# Patient Record
Sex: Male | Born: 1975 | Race: White | Hispanic: No | Smoking: Never smoker
Health system: Southern US, Community
[De-identification: ages and names within clinical notes are randomized; demographics above are authoritative.]

## PROBLEM LIST (undated history)

## (undated) DIAGNOSIS — E119 Type 2 diabetes mellitus without complications: Secondary | ICD-10-CM

## (undated) HISTORY — PX: ELBOW SURGERY: SHX618

---

## 2003-12-07 ENCOUNTER — Other Ambulatory Visit: Payer: Self-pay

## 2019-11-05 ENCOUNTER — Encounter (HOSPITAL_COMMUNITY): Payer: Self-pay | Admitting: Emergency Medicine

## 2019-11-05 ENCOUNTER — Emergency Department (HOSPITAL_COMMUNITY): Payer: Self-pay

## 2019-11-05 ENCOUNTER — Other Ambulatory Visit: Payer: Self-pay

## 2019-11-05 ENCOUNTER — Emergency Department (HOSPITAL_COMMUNITY)
Admission: EM | Admit: 2019-11-05 | Discharge: 2019-11-05 | Disposition: A | Discharge status: Home / Self Care | Payer: Self-pay | Attending: Emergency Medicine | Admitting: Emergency Medicine

## 2019-11-05 DIAGNOSIS — R569 Unspecified convulsions: Secondary | ICD-10-CM | POA: Insufficient documentation

## 2019-11-05 DIAGNOSIS — Z794 Long term (current) use of insulin: Secondary | ICD-10-CM | POA: Insufficient documentation

## 2019-11-05 DIAGNOSIS — E1165 Type 2 diabetes mellitus with hyperglycemia: Secondary | ICD-10-CM | POA: Insufficient documentation

## 2019-11-05 DIAGNOSIS — E162 Hypoglycemia, unspecified: Secondary | ICD-10-CM

## 2019-11-05 HISTORY — DX: Type 2 diabetes mellitus without complications: E11.9

## 2019-11-05 LAB — COMPREHENSIVE METABOLIC PANEL
ALT: 28 U/L (ref 0–44)
AST: 35 U/L (ref 15–41)
Albumin: 4 g/dL (ref 3.5–5.0)
Alkaline Phosphatase: 78 U/L (ref 38–126)
Anion gap: 12 (ref 5–15)
BUN: 9 mg/dL (ref 6–20)
CO2: 20 mmol/L — ABNORMAL LOW (ref 22–32)
Calcium: 9.2 mg/dL (ref 8.9–10.3)
Chloride: 106 mmol/L (ref 98–111)
Creatinine, Ser: 1 mg/dL (ref 0.61–1.24)
GFR calc Af Amer: >60 mL/min (ref >60)
GFR calc non Af Amer: >60 mL/min (ref >60)
Glucose, Bld: 86 mg/dL (ref 70–99)
Potassium: 4.4 mmol/L (ref 3.5–5.1)
Sodium: 138 mmol/L (ref 135–145)
Total Bilirubin: 0.8 mg/dL (ref 0.3–1.2)
Total Protein: 7.1 g/dL (ref 6.5–8.1)

## 2019-11-05 LAB — CBC WITH DIFFERENTIAL/PLATELET
Abs Immature Granulocytes: 0.01 10*3/uL (ref 0.00–0.07)
Basophils Absolute: 0 10*3/uL (ref 0.0–0.1)
Basophils Relative: 0 %
Eosinophils Absolute: 0 10*3/uL (ref 0.0–0.5)
Eosinophils Relative: 0 %
HCT: 46.4 % (ref 39.0–52.0)
Hemoglobin: 15.1 g/dL (ref 13.0–17.0)
Immature Granulocytes: 0 %
Lymphocytes Relative: 18 %
Lymphs Abs: 1.3 10*3/uL (ref 0.7–4.0)
MCH: 28.5 pg (ref 26.0–34.0)
MCHC: 32.5 g/dL (ref 30.0–36.0)
MCV: 87.7 fL (ref 80.0–100.0)
Monocytes Absolute: 0.6 10*3/uL (ref 0.1–1.0)
Monocytes Relative: 8 %
Neutro Abs: 5.5 10*3/uL (ref 1.7–7.7)
Neutrophils Relative %: 74 %
Platelets: 312 10*3/uL (ref 150–400)
RBC: 5.29 MIL/uL (ref 4.22–5.81)
RDW: 13.2 % (ref 11.5–15.5)
WBC: 7.4 10*3/uL (ref 4.0–10.5)
nRBC: 0 % (ref 0.0–0.2)

## 2019-11-05 LAB — CBG MONITORING, ED
Glucose-Capillary: 69 mg/dL — ABNORMAL LOW (ref 70–99)
Glucose-Capillary: 84 mg/dL (ref 70–99)

## 2019-11-05 LAB — ETHANOL: Alcohol, Ethyl (B): 13 mg/dL — ABNORMAL HIGH (ref <10)

## 2019-11-05 LAB — MAGNESIUM: Magnesium: 2.1 mg/dL (ref 1.7–2.4)

## 2019-11-05 MED ORDER — LORAZEPAM 2 MG/ML IJ SOLN: 1.0000 mg | Freq: Once | INTRAMUSCULAR | Status: DC | PRN

## 2019-11-05 NOTE — ED Notes (Signed)
Patient transported to CT 

## 2019-11-05 NOTE — ED Triage Notes (Signed)
Per [facility] pt coming from girlfriends house after having witnessed seizure this am. Unsure how long, EMS reports pt post ictal on arrival and throughout transport. Initial CBG 55, given D10 and CBG to 90. Patient alert and orientated x 4 on arrival. Bite marks on tongue. Denies any memory of seizure. Reports drinking wine last night but denies drinking regularly. Diabetic takes 70/30 but has not taken today.

## 2019-11-05 NOTE — ED Notes (Signed)
CBG collected. Result "60." RN, Florentina Addison R., notified.

## 2019-11-05 NOTE — ED Notes (Signed)
Pt ambulated to the restroom with a steady gait and no complaints.

## 2019-11-05 NOTE — ED Provider Notes (Signed)
Emergency Department Provider Note   I have reviewed the triage vital signs and the nursing notes.   HISTORY  Chief Complaint Seizures   HPI Douglas Carson is a 44 y.o. male with a history of insulin-dependent diabetes who presents to the emergency department today secondary to seizure activity.  Ultimately figured out that the patient had a blood sugar less than 30 and fianc woke up with him shaking and apparent seizure-like activity.  Glucose was given on EMS arrival it was in the 50s.  Did have some blood around his mouth.  Patient does not remember what happened this morning but ultimately was able to remember that he did take sling last night had a blood sugar of 90 and actually decrease his dose a little bit but took it prior to bedtime.  He did have a couple alcoholic drinks but was not intoxicated.   No other associated or modifying symptoms.    Past Medical History:  Diagnosis Date  . Diabetes mellitus without complication (HCC)     There are no problems to display for this patient.   Past Surgical History:  Procedure Laterality Date  . ELBOW SURGERY        Allergies Patient has no known allergies.  No family history on file.  Social History Social History   Tobacco Use  . Smoking status: Never Smoker  . Smokeless tobacco: Never Used  Substance Use Topics  . Alcohol use: Yes  . Drug use: Not Currently    Review of Systems  All other systems negative except as documented in the HPI. All pertinent positives and negatives as reviewed in the HPI. ____________________________________________   PHYSICAL EXAM:  VITAL SIGNS: ED Triage Vitals  Enc Vitals Group     BP 11/05/19 0851 (!) 127/95     Pulse Rate 11/05/19 0851 71     Resp 11/05/19 0851 18     Temp 11/05/19 0851 97.7 F (36.5 C)     Temp Source 11/05/19 0851 Oral     SpO2 11/05/19 0851 100 %     Weight 11/05/19 0858 220 lb (99.8 kg)     Height 11/05/19 0858 6\' 1"  (1.854 m)     Constitutional: Alert and oriented. Well appearing and in no acute distress. Eyes: Conjunctivae are normal. PERRL. EOMI. Head: Atraumatic. Nose: No congestion/rhinnorhea. Mouth/Throat: Mucous membranes are moist.  Oropharynx non-erythematous. Bilateral tongue bites Neck: No stridor.  No meningeal signs.   Cardiovascular: Normal rate, regular rhythm. Good peripheral circulation. Grossly normal heart sounds.   Respiratory: Normal respiratory effort.  No retractions. Lungs CTAB. Gastrointestinal: Soft and nontender. No distention.  Musculoskeletal: No lower extremity tenderness nor edema. No gross deformities of extremities. Neurologic:  Normal speech and language. No gross focal neurologic deficits are appreciated.  Skin:  Skin is warm, dry and intact. No rash noted.   ____________________________________________   LABS (all labs ordered are listed, but only abnormal results are displayed)  Labs Reviewed  COMPREHENSIVE METABOLIC PANEL - Abnormal; Notable for the following components:      Result Value   CO2 20 (*)    All other components within normal limits  ETHANOL - Abnormal; Notable for the following components:   Alcohol, Ethyl (B) 13 (*)    All other components within normal limits  CBG MONITORING, ED - Abnormal; Notable for the following components:   Glucose-Capillary 69 (*)    All other components within normal limits  CBC WITH DIFFERENTIAL/PLATELET  MAGNESIUM  URINALYSIS, ROUTINE  W REFLEX MICROSCOPIC  CBG MONITORING, ED   ____________________________________________  EKG   EKG Interpretation  Date/Time:  Sunday November 05 2019 08:53:26 EDT Ventricular Rate:  97 PR Interval:    QRS Duration: 100 QT Interval:  367 QTC Calculation: 467 R Axis:   84 Text Interpretation: Sinus rhythm ST elev, probable normal early repol pattern waves with higher amplitude than previous Confirmed by Merrily Pew [phone]) on 11/05/2019 9:05:14 AM       ____________________________________________  RADIOLOGY  DG Chest 2 View  Result Date: 11/05/2019 CLINICAL DATA:  Evaluate for seizure. EXAM: CHEST - 2 VIEW COMPARISON:  Report from chest radiograph 12/07/2003 (images unavailable) FINDINGS: Heart size within normal limits. No evidence of airspace consolidation within the lungs. No evidence of pleural effusion or pneumothorax. No acute bony abnormality. IMPRESSION: No evidence of acute cardiopulmonary abnormality. Electronically Signed   By: Kellie Simmering DO   On: 11/05/2019 09:32   CT HEAD WO CONTRAST  Result Date: 11/05/2019 CLINICAL DATA:  Headache, head trauma. Additional history obtained from [place] seizure. EXAM: CT HEAD WITHOUT CONTRAST TECHNIQUE: Contiguous axial images were obtained from the base of the skull through the vertex without intravenous contrast. COMPARISON:  Report from head CT 12/07/2003 (images unavailable) FINDINGS: Brain: There is no evidence of acute intracranial hemorrhage, intracranial mass, midline shift or extra-axial fluid collection.No demarcated cortical infarction. Vascular: No hyperdense vessel. Skull: Normal. Negative for fracture or focal lesion. Sinuses/Orbits: Visualized orbits demonstrate no acute abnormality. Mild paranasal sinus mucosal thickening greatest within the right maxillary sinus. There is also a small air-fluid level within the right maxillary sinus. Right sphenoid sinus mucous retention cyst. No significant mastoid effusion. IMPRESSION: No evidence of acute intracranial abnormality. Paranasal sinus disease as described. This includes a small right maxillary sinus air-fluid level. Correlate for acute on chronic sinusitis. Electronically Signed   By: Kellie Simmering DO   On: 11/05/2019 09:52    ____________________________________________   PROCEDURES  Procedure(s) performed:   Procedures   ____________________________________________   INITIAL IMPRESSION /  ASSESSMENT AND PLAN / ED COURSE  Info ultimately came out that patient was significantly hypoglycemic and is likely cause of seizure activity. No indication for neuro follow up. Will not start anti-epileptics for the same. cbg's controlled here. Observed for multiple hours without recurrent hypoglycemia or seizure. Renal function unremarkable. Will not change insulin dose at this time. Will need fu w pcp for same.      Pertinent labs & imaging results that were available during my care of the patient were reviewed by me and considered in my medical decision making (see chart for details).   A medical screening exam was performed and I feel the patient has had an appropriate workup for their chief complaint at this time and likelihood of emergent condition existing is low. They have been counseled on decision, discharge, follow up and which symptoms necessitate immediate return to the emergency department. They or their family verbally stated understanding and agreement with plan and discharged in stable condition.   ____________________________________________  FINAL CLINICAL IMPRESSION(S) / ED DIAGNOSES  Final diagnoses:  Seizure ([place])  Hypoglycemia     MEDICATIONS GIVEN DURING THIS VISIT:  Medications  LORazepam (ATIVAN) injection 1 mg (has no administration in time range)     NEW OUTPATIENT MEDICATIONS STARTED DURING THIS VISIT:  New Prescriptions   No medications on file    Note:  This note was prepared with assistance of Dragon voice recognition software. Occasional wrong-word or  sound-a-like substitutions may have occurred due to the inherent limitations of voice recognition software.   Neeta Storey, Barbara Cower, MD 11/06/19 [phone]

## 2019-11-05 NOTE — ED Notes (Signed)
Pt provided cup of sprite. Okayed by RN, [place] Planas

## 2019-11-05 NOTE — ED Notes (Signed)
Patient transported to X-ray 

## 2019-11-11 ENCOUNTER — Ambulatory Visit: Payer: Self-pay

## 2020-07-27 IMAGING — CT CT HEAD W/O CM
3 series · 16 of 47 positions shown, 19 images · non-contrast
Comparison: Report from head CT 12/07/2003 (images unavailable)

CLINICAL DATA: Headache, head trauma. Additional history obtained
from electronic medical record: Witness seizure.

EXAM:
CT HEAD WITHOUT CONTRAST
TECHNIQUE: Contiguous axial images were obtained from the base of the skull
through the vertex without intravenous contrast.

[Series 3: head 5.0 h30s · axial · 0.44mm/px · z∈[-134,+1]mm · 10 of 33 slices shown, 13 images]
[im 3/33  brain]
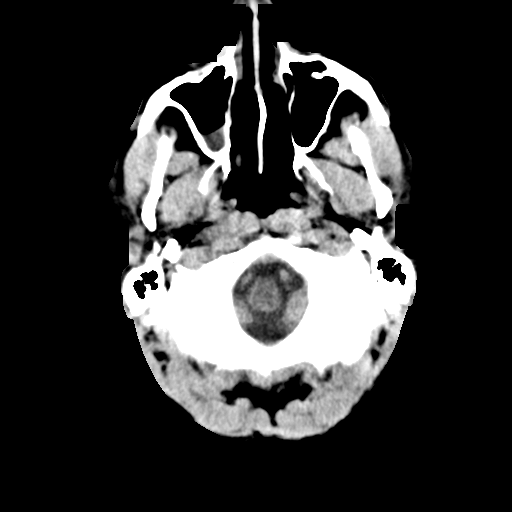
[im 3/33  bone]
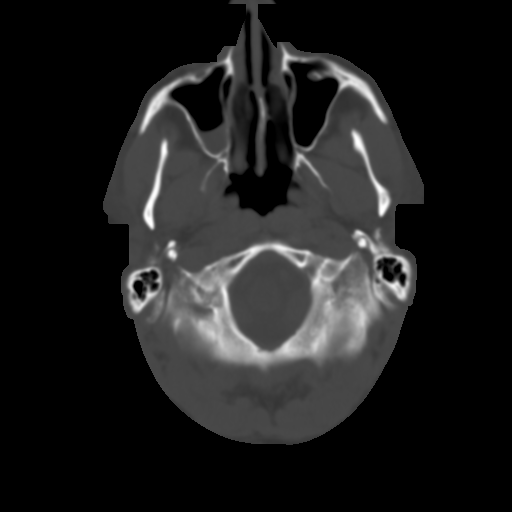
[im 6/33  brain]
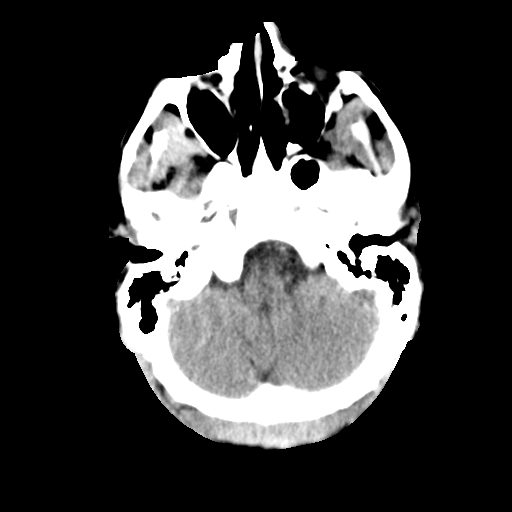
[im 9/33  brain]
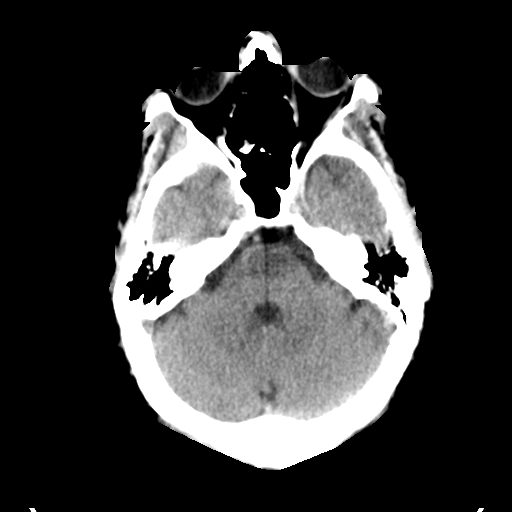
[im 12/33  brain]
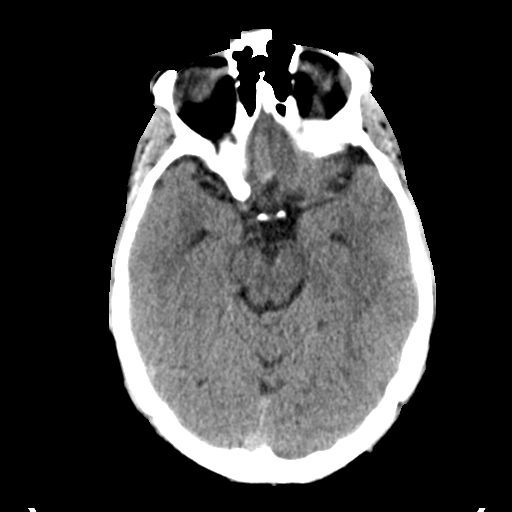
[im 15/33  brain]
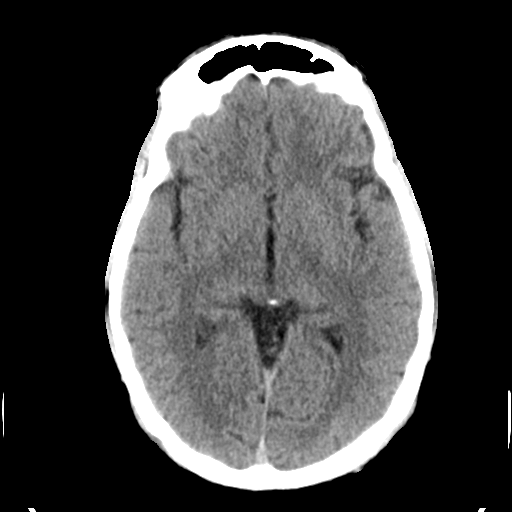
[im 15/33  bone]
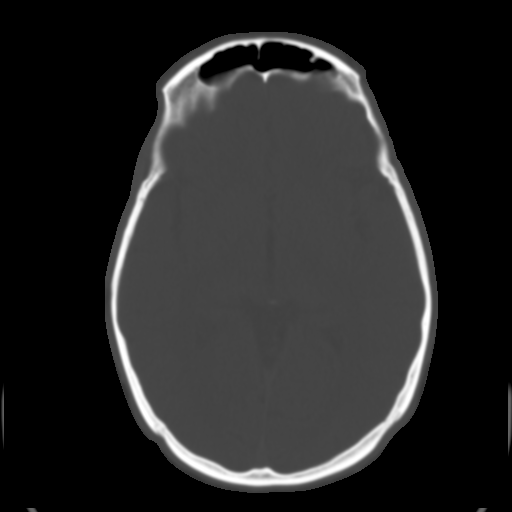
[im 18/33  brain]
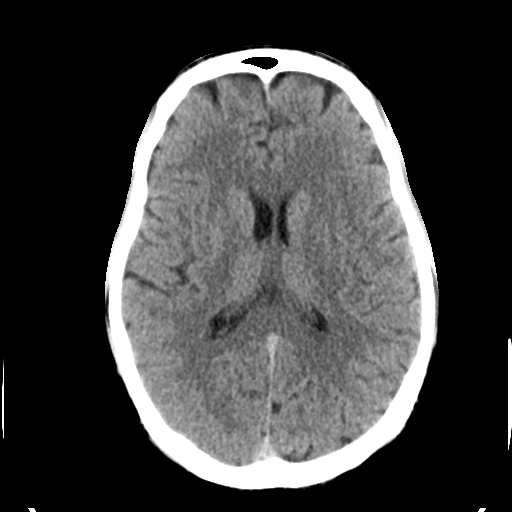
[im 21/33  brain]
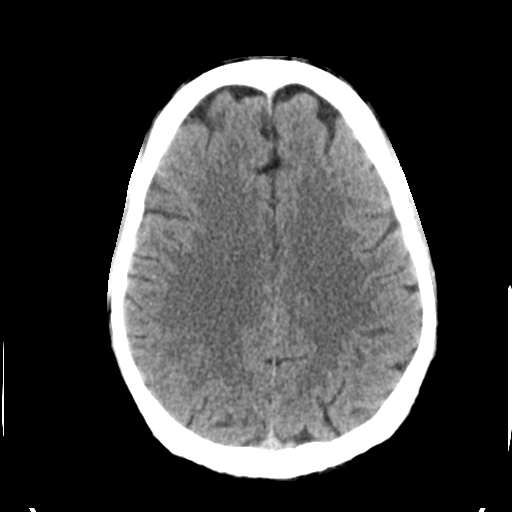
[im 25/33  brain]
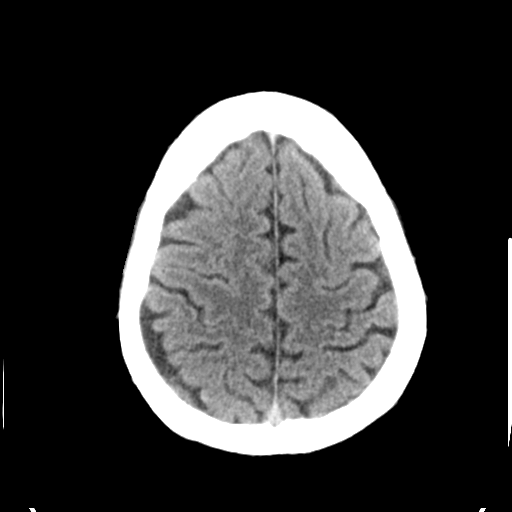
[im 27/33  brain]
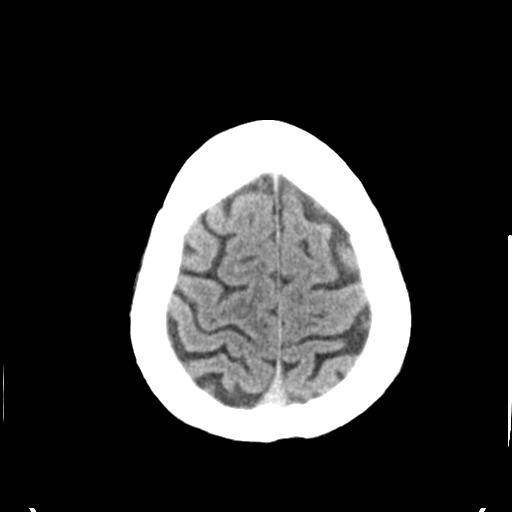
[im 27/33  bone]
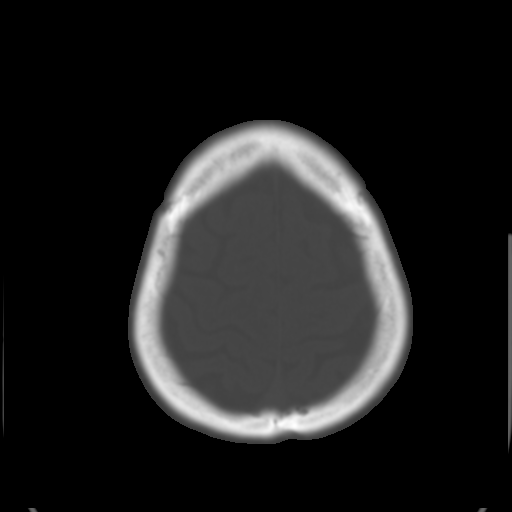
[im 30/33  brain]
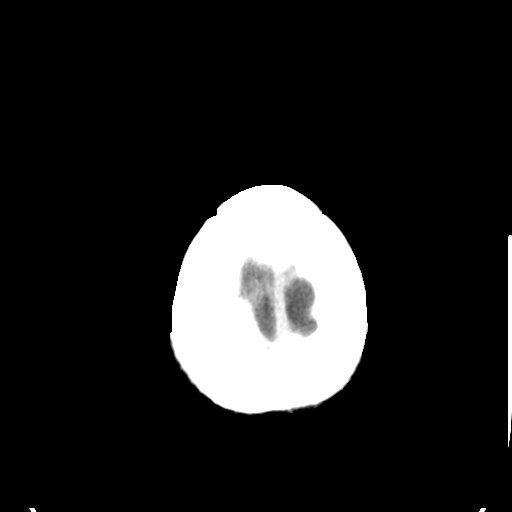

[Series 5: head 3.0 mpr cor · coronal · 0.32mm/px · 3 of 72 slices shown]
[im 24/72  brain]
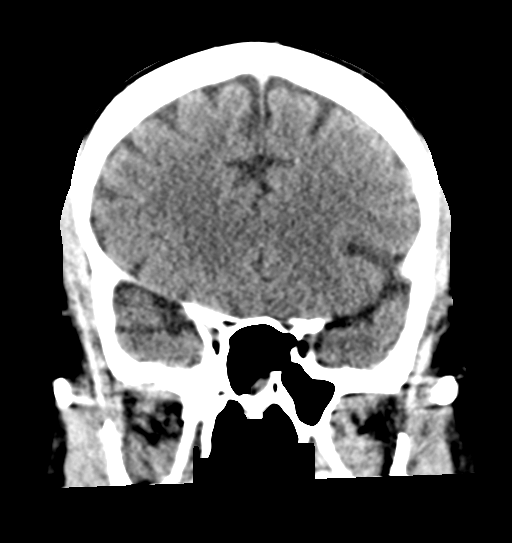
[im 32/72  brain]
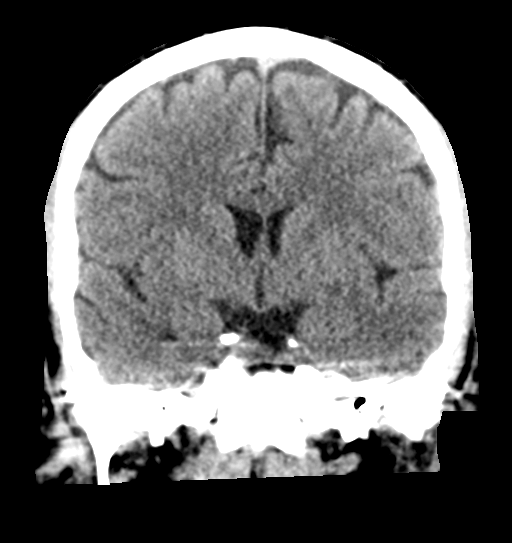
[im 40/72  brain]
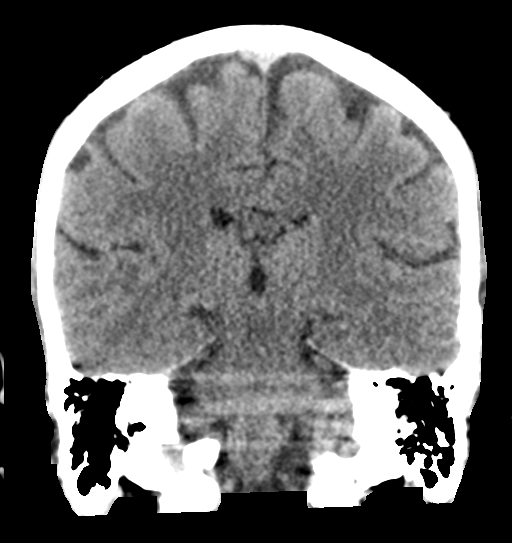

[Series 6: head 3.0 mpr sag · sagittal · 0.34mm/px · 3 of 56 slices shown]
[im 19/56  brain]
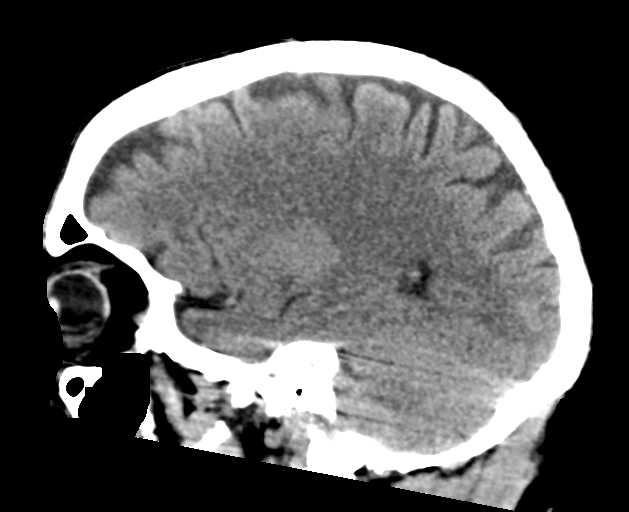
[im 28/56  brain]
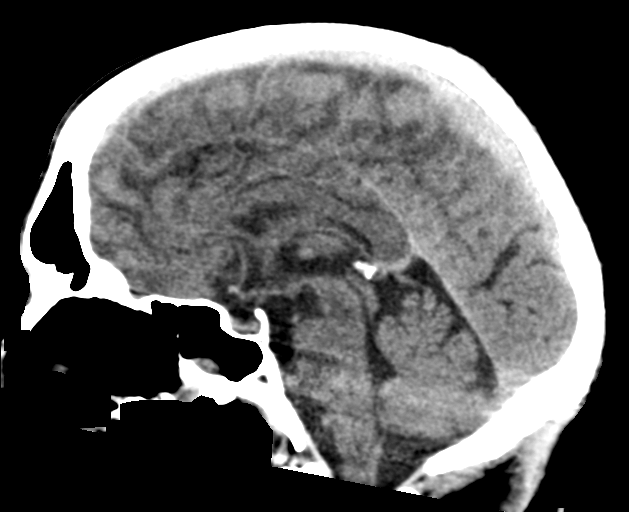
[im 37/56  brain]
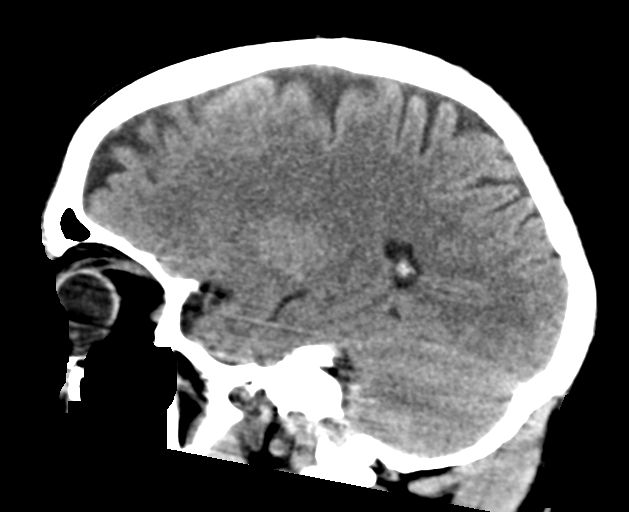

[16 of 47 positions shown; findings below may reference images not displayed]

FINDINGS: Brain: There is no evidence of acute intracranial hemorrhage,
intracranial mass, midline shift or extra-axial fluid collection.No
demarcated cortical infarction.

Vascular: No hyperdense vessel.

Skull: Normal. Negative for fracture or focal lesion.

Sinuses/Orbits: Visualized orbits demonstrate no acute abnormality.
Mild paranasal sinus mucosal thickening greatest within the right
maxillary sinus. There is also a small air-fluid level within the
right maxillary sinus. Right sphenoid sinus mucous retention cyst.
No significant mastoid effusion.
IMPRESSION: No evidence of acute intracranial abnormality.

Paranasal sinus disease as described. This includes a small right
maxillary sinus air-fluid level. Correlate for acute on chronic
sinusitis.
